# Patient Record
Sex: Male | Born: 1975 | Race: Black or African American | Hispanic: No | Marital: Single | Smoking: Current every day smoker
Health system: Southern US, Community
[De-identification: ages and names within clinical notes are randomized; demographics above are authoritative.]

---

## 2005-09-30 ENCOUNTER — Emergency Department (HOSPITAL_COMMUNITY): Admission: EM | Admit: 2005-09-30 | Discharge: 2005-09-30 | Payer: Self-pay | Admitting: Emergency Medicine

## 2010-05-31 ENCOUNTER — Emergency Department (HOSPITAL_COMMUNITY): Admission: EM | Admit: 2010-05-31 | Discharge: 2010-05-31 | Payer: Self-pay | Admitting: Emergency Medicine

## 2010-11-09 ENCOUNTER — Emergency Department (HOSPITAL_COMMUNITY)
Admission: EM | Admit: 2010-11-09 | Discharge: 2010-11-09 | Payer: Self-pay | Source: Home / Self Care | Admitting: Emergency Medicine

## 2011-02-17 LAB — POCT CARDIAC MARKERS
CKMB, poc: <1 ng/mL — ABNORMAL LOW (ref 1.0–8.0)
Myoglobin, poc: 37.8 ng/mL (ref 12–200)
Troponin i, poc: <0.05 ng/mL (ref 0.00–0.09)

## 2016-10-07 ENCOUNTER — Emergency Department (HOSPITAL_COMMUNITY)
Admission: EM | Admit: 2016-10-07 | Discharge: 2016-10-07 | Disposition: A | Discharge status: Home / Self Care | Payer: Self-pay | Attending: Dermatology | Admitting: Dermatology

## 2016-10-07 ENCOUNTER — Encounter (HOSPITAL_COMMUNITY): Payer: Self-pay

## 2016-10-07 DIAGNOSIS — Y929 Unspecified place or not applicable: Secondary | ICD-10-CM | POA: Insufficient documentation

## 2016-10-07 DIAGNOSIS — S0591XA Unspecified injury of right eye and orbit, initial encounter: Secondary | ICD-10-CM | POA: Insufficient documentation

## 2016-10-07 DIAGNOSIS — Y939 Activity, unspecified: Secondary | ICD-10-CM | POA: Insufficient documentation

## 2016-10-07 DIAGNOSIS — Z5321 Procedure and treatment not carried out due to patient leaving prior to being seen by health care provider: Secondary | ICD-10-CM | POA: Insufficient documentation

## 2016-10-07 DIAGNOSIS — W228XXA Striking against or struck by other objects, initial encounter: Secondary | ICD-10-CM | POA: Insufficient documentation

## 2016-10-07 DIAGNOSIS — Y999 Unspecified external cause status: Secondary | ICD-10-CM | POA: Insufficient documentation
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
## 2018-01-19 ENCOUNTER — Encounter (HOSPITAL_COMMUNITY): Payer: Self-pay | Admitting: Emergency Medicine

## 2018-01-19 ENCOUNTER — Emergency Department (HOSPITAL_COMMUNITY)
Admission: EM | Admit: 2018-01-19 | Discharge: 2018-01-19 | Disposition: A | Discharge status: Home / Self Care | Payer: Self-pay | Attending: Emergency Medicine | Admitting: Emergency Medicine

## 2018-01-19 ENCOUNTER — Emergency Department (HOSPITAL_COMMUNITY): Payer: Self-pay

## 2018-01-19 DIAGNOSIS — F17200 Nicotine dependence, unspecified, uncomplicated: Secondary | ICD-10-CM | POA: Insufficient documentation

## 2018-01-19 DIAGNOSIS — R0789 Other chest pain: Secondary | ICD-10-CM | POA: Insufficient documentation

## 2018-01-19 LAB — COMPREHENSIVE METABOLIC PANEL
ALT: 14 U/L — ABNORMAL LOW (ref 17–63)
AST: 25 U/L (ref 15–41)
Albumin: 4.1 g/dL (ref 3.5–5.0)
Alkaline Phosphatase: 121 U/L (ref 38–126)
Anion gap: 8 (ref 5–15)
BUN: 12 mg/dL (ref 6–20)
CO2: 25 mmol/L (ref 22–32)
Calcium: 9.3 mg/dL (ref 8.9–10.3)
Chloride: 106 mmol/L (ref 101–111)
Creatinine, Ser: 1.01 mg/dL (ref 0.61–1.24)
GFR calc Af Amer: >60 mL/min (ref >60)
GFR calc non Af Amer: >60 mL/min (ref >60)
Glucose, Bld: 92 mg/dL (ref 65–99)
Potassium: 4.3 mmol/L (ref 3.5–5.1)
Sodium: 139 mmol/L (ref 135–145)
Total Bilirubin: 1.2 mg/dL (ref 0.3–1.2)
Total Protein: 7.2 g/dL (ref 6.5–8.1)

## 2018-01-19 LAB — CBC WITH DIFFERENTIAL/PLATELET
Basophils Absolute: 0 10*3/uL (ref 0.0–0.1)
Basophils Relative: 0 %
Eosinophils Absolute: 0.1 10*3/uL (ref 0.0–0.7)
Eosinophils Relative: 1 %
HCT: 43.3 % (ref 39.0–52.0)
Hemoglobin: 14.8 g/dL (ref 13.0–17.0)
Lymphocytes Relative: 21 %
Lymphs Abs: 2.6 10*3/uL (ref 0.7–4.0)
MCH: 32.7 pg (ref 26.0–34.0)
MCHC: 34.2 g/dL (ref 30.0–36.0)
MCV: 95.6 fL (ref 78.0–100.0)
Monocytes Absolute: 0.9 10*3/uL (ref 0.1–1.0)
Monocytes Relative: 7 %
Neutro Abs: 8.9 10*3/uL — ABNORMAL HIGH (ref 1.7–7.7)
Neutrophils Relative %: 71 %
Platelets: 233 10*3/uL (ref 150–400)
RBC: 4.53 MIL/uL (ref 4.22–5.81)
RDW: 14.9 % (ref 11.5–15.5)
WBC: 12.5 10*3/uL — ABNORMAL HIGH (ref 4.0–10.5)

## 2018-01-19 LAB — I-STAT TROPONIN, ED: Troponin i, poc: 0 ng/mL (ref 0.00–0.08)

## 2018-01-19 LAB — D-DIMER, QUANTITATIVE: D-Dimer, Quant: 0.28 ug/mL-FEU (ref 0.00–0.50)

## 2018-01-19 MED ORDER — CYCLOBENZAPRINE HCL 5 MG PO TABS: 5.0000 mg | ORAL_TABLET | Freq: Three times a day (TID) | ORAL | 0 refills | Status: AC | PRN

## 2018-01-19 MED ORDER — MORPHINE SULFATE (PF) 4 MG/ML IV SOLN
4.0000 mg | Freq: Once | INTRAVENOUS | Status: AC
  Administered 2018-01-19: 4 mg via INTRAVENOUS
  Filled 2018-01-19: qty 1

## 2018-01-19 MED ORDER — IBUPROFEN 600 MG PO TABS: 600.0000 mg | ORAL_TABLET | Freq: Four times a day (QID) | ORAL | 0 refills | Status: AC | PRN

## 2018-01-19 NOTE — Discharge Instructions (Signed)
Take motrin for pain.   Take flexeril for muscle spasms.   Expect to be sore for 3-4 days.   Rest for 2 days (including no sex for 2 days).   See your doctor  Return to ER if you have worse chest pain, trouble breathing.

## 2018-01-19 NOTE — ED Provider Notes (Signed)
[facility]-EMERGENCY DEPT Provider Note   CSN: [id number] Arrival date & time: 01/19/18  0935     History   Chief Complaint Chief Complaint  Patient presents with  . Chest Pain    HPI Dustin Haney is a 42 y.o. male smoker here presenting with chest pain.  Patient states that he had sexual intercourse several days ago and afterwards, he has some right-sided chest pain that is worse with movement.  States that when he gets up he has to hold his chest and felt sore when he moves around.  He states that it is also worse when he takes a deep breath as well.  Patient recently traveled here from [place] and is currently living with his girlfriend.  He denies any leg swelling or previous blood clots.  Patient states that he has no known cardiac disease or cardiac stents.  Patient states that he had something similar before and went to [facility] and was diagnosed with chest wall pain. No hx of DM, HTN but patient is a smoker.   The history is provided by the patient.    History reviewed. No pertinent past medical history.  There are no active problems to display for this patient.   History reviewed. No pertinent surgical history.     Home Medications    Prior to Admission medications   Not on File    Family History No family history on file.  Social History Social History   Tobacco Use  . Smoking status: Current Every Day Smoker  . Smokeless tobacco: Never Used  Substance Use Topics  . Alcohol use: Yes  . Drug use: Not on file     Allergies   Latex   Review of Systems Review of Systems  Cardiovascular: Positive for chest pain.  All other systems reviewed and are negative.    Physical Exam Updated Vital Signs BP 130/85 (BP Location: Left Arm)   Pulse 60   Temp 97.9 F (36.6 C) (Oral)   Resp 18   Ht 5' 10.5" (1.791 m)   Wt 72.6 kg (160 lb)   SpO2 99%   BMI 22.63 kg/m   Physical Exam  Constitutional: He appears  well-developed.  HENT:  Head: Normocephalic.  Eyes: Pupils are equal, round, and reactive to light.  Neck: Normal range of motion.  Cardiovascular: Normal rate, regular rhythm and normal pulses.  Pulmonary/Chest: Effort normal and breath sounds normal.  Mild reproducible R sided chest tenderness   Abdominal: Soft. Bowel sounds are normal.  Musculoskeletal: Normal range of motion.       Right lower leg: Normal. He exhibits no tenderness and no edema.       Left lower leg: Normal. He exhibits no tenderness and no edema.  Neurological: He is alert.  Skin: Skin is warm. Capillary refill takes less than 2 seconds.  Psychiatric: He has a normal mood and affect. His behavior is normal.  Nursing note and vitals reviewed.    ED Treatments / Results  Labs (all labs ordered are listed, but only abnormal results are displayed) Labs Reviewed  CBC WITH DIFFERENTIAL/PLATELET  COMPREHENSIVE METABOLIC PANEL  D-DIMER, QUANTITATIVE (NOT AT [facility])  I-STAT TROPONIN, ED    EKG  EKG Interpretation  Date/Time:  Monday January 19 2018 09:48:45 EST Ventricular Rate:  56 PR Interval:    QRS Duration: 87 QT Interval:  420 QTC Calculation: 406 R Axis:   56 Text Interpretation:  Sinus rhythm RSR' in V1 or V2,  probably normal variant Inferior infarct, acute (LCx) ST elevation, consider anterior injury Baseline wander in lead(s) II III aVF J point elevation unchanged since earlier in the day  Confirmed by Richardean CanalYao, Thailyn Khalid H [id number]) on 01/19/2018 10:05:41 AM       Radiology No results found.  Procedures Procedures (including critical care time)  Medications Ordered in ED Medications  morphine 4 MG/ML injection 4 mg (not administered)     Initial Impression / Assessment and Plan / ED Course  I have reviewed the triage vital signs and the nursing notes.  Pertinent labs & imaging results that were available during my care of the patient were reviewed by me and considered in my medical decision making  (see chart for details).     Dustin Haney is a 42 y.o. male here presenting with chest pain after having sex several days ago. There is reproducible R sided tenderness. I suspect muscle strain. Given recent travel, consider PE as well vs ACS. Will get labs, d-dimer, trop x 1 (symptoms more than 24 hrs), CXR.   11:48 AM Pain improved with meds. CXR clear. Trop neg. D-dimer negative. Likely chest wall pain from muscle strain. Recommend rest for 2 days (including no sex). Will dc home with motrin, flexeril.    Final Clinical Impressions(s) / ED Diagnoses   Final diagnoses:  None    ED Discharge Orders    None       Charlynne PanderYao, Ketara Cavness Hsienta, MD 01/19/18 1148

## 2018-01-19 NOTE — ED Triage Notes (Signed)
PER [facility] patient from home for chest wall pain x 4 days. Reports when holds chest with movement pain feels better. Patient is smoker. EKG was normal with EMS.

## 2018-10-01 ENCOUNTER — Emergency Department (HOSPITAL_COMMUNITY)
Admission: EM | Admit: 2018-10-01 | Discharge: 2018-10-01 | Disposition: A | Discharge status: Home / Self Care | Payer: Self-pay | Attending: Emergency Medicine | Admitting: Emergency Medicine

## 2018-10-01 ENCOUNTER — Emergency Department (HOSPITAL_COMMUNITY): Admission: EM | Admit: 2018-10-01 | Discharge: 2018-10-01 | Discharge status: Absent without leave | Payer: Self-pay

## 2018-10-01 ENCOUNTER — Other Ambulatory Visit: Payer: Self-pay

## 2018-10-01 ENCOUNTER — Encounter (HOSPITAL_COMMUNITY): Payer: Self-pay

## 2018-10-01 ENCOUNTER — Emergency Department (HOSPITAL_COMMUNITY): Payer: Self-pay

## 2018-10-01 DIAGNOSIS — Y9389 Activity, other specified: Secondary | ICD-10-CM | POA: Insufficient documentation

## 2018-10-01 DIAGNOSIS — Y999 Unspecified external cause status: Secondary | ICD-10-CM | POA: Insufficient documentation

## 2018-10-01 DIAGNOSIS — Y92009 Unspecified place in unspecified non-institutional (private) residence as the place of occurrence of the external cause: Secondary | ICD-10-CM | POA: Insufficient documentation

## 2018-10-01 DIAGNOSIS — R040 Epistaxis: Secondary | ICD-10-CM | POA: Insufficient documentation

## 2018-10-01 DIAGNOSIS — S060X1A Concussion with loss of consciousness of 30 minutes or less, initial encounter: Secondary | ICD-10-CM

## 2018-10-01 DIAGNOSIS — Z23 Encounter for immunization: Secondary | ICD-10-CM | POA: Insufficient documentation

## 2018-10-01 DIAGNOSIS — S022XXA Fracture of nasal bones, initial encounter for closed fracture: Secondary | ICD-10-CM | POA: Insufficient documentation

## 2018-10-01 MED ORDER — OXYMETAZOLINE HCL 0.05 % NA SOLN
1.0000 | Freq: Once | NASAL | Status: AC
  Administered 2018-10-01: 1 via NASAL
  Filled 2018-10-01: qty 15

## 2018-10-01 MED ORDER — TETANUS-DIPHTH-ACELL PERTUSSIS 5-2.5-18.5 LF-MCG/0.5 IM SUSP
0.5000 mL | Freq: Once | INTRAMUSCULAR | Status: AC
  Administered 2018-10-01: 0.5 mL via INTRAMUSCULAR
  Filled 2018-10-01: qty 0.5

## 2018-10-01 MED ORDER — ONDANSETRON 4 MG PO TBDP
4.0000 mg | ORAL_TABLET | Freq: Once | ORAL | Status: AC
  Administered 2018-10-01: 4 mg via ORAL
  Filled 2018-10-01: qty 1

## 2018-10-01 MED ORDER — TRAMADOL HCL 50 MG PO TABS: 50.0000 mg | ORAL_TABLET | Freq: Four times a day (QID) | ORAL | 0 refills | Status: AC | PRN

## 2018-10-01 MED ORDER — IBUPROFEN 600 MG PO TABS: 600.0000 mg | ORAL_TABLET | Freq: Four times a day (QID) | ORAL | 0 refills | Status: AC | PRN

## 2018-10-01 MED ORDER — AMOXICILLIN-POT CLAVULANATE 875-125 MG PO TABS: 1.0000 | ORAL_TABLET | Freq: Two times a day (BID) | ORAL | 0 refills | Status: AC

## 2018-10-01 MED ORDER — MORPHINE SULFATE (PF) 4 MG/ML IV SOLN
4.0000 mg | Freq: Once | INTRAVENOUS | Status: AC
  Administered 2018-10-01: 4 mg via INTRAMUSCULAR
  Filled 2018-10-01: qty 1

## 2018-10-01 MED ORDER — OXYMETAZOLINE HCL 0.05 % NA SOLN
1.0000 | Freq: Two times a day (BID) | NASAL | 0 refills | Status: AC | PRN
Start: 2018-10-01 — End: ?
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
## 2018-10-01 NOTE — ED Notes (Signed)
Patient transported to CT 

## 2018-10-01 NOTE — Discharge Instructions (Addendum)
You were found to have a nasal fracture. Please see attached handout.   Please take all of your antibiotics until finished!   You may develop abdominal discomfort or diarrhea from the antibiotic.  You may help offset this with probiotics which you can buy or get in yogurt. Do not eat or take the probiotics until 2 hours after your antibiotic. Do not take your medicine if develop an itchy rash, swelling in your mouth or lips, or difficulty breathing.   I am giving you Oxymetazoline - nasal spray for congestion. Do not use for more than 3 days because this medicine can cause rebound congestion.   For pain control you may take:  600mg  of ibuprofen (that is usually 3 over the counter pills)  3 times a day (take with food) and acetaminophen 650mg  (this is 2 over the counter pills) four times a day. Do not drink alcohol or combine with other medications that have acetaminophen as an ingredient (Read the labels!).    For pain control you may take: 800mg  of ibuprofen (that is usually four 200mg  over the counter pills) up to 3 times a day (please take with food) and acetaminophen 975mg  (this is 3 normal strength, 325mg , over the counter pills) up to four times a day. Please do not take more than this. Do not drink alcohol or combine with other medications that have acetaminophen or Ibuprofen as an ingredient (Read the labels!).    For breakthrough pain you may take Ultram. Do not drink alcohol drive or operate heavy machinery when taking. You are being provided a prescription for opiates (also known as narcotics) for pain control on an as needed basis.  Opiates can be addictive and should only be used when absolutely necessary for pain control when other alternatives do not work.  We recommend you only use them for the recommended amount of time and only as prescribed.  Please do not take with other sedative medications or alcohol.  Please do not drive, operate machinery, or make important decisions while taking  opiates.  Please note that these medications can be addictive and have high abuse potential.  Please keep these medications locked away from children, teenagers or any family members with history of substance abuse. Additionally, these medications may cause constipation - take over the counter stool softeners or add fiber to your diet to treat this (Metamucil, Psyllium Fiber, Colace, Miralax) Further refills will need to be obtained from your primary care doctor and will not be prescribed through the Emergency Department. You will test positive on most drug tests while taking this medication.   Please follow up with ENT in the next week for follow up. If you develop worsening or new concerning symptoms you can return to the emergency department for re-evaluation.   NOSE BLEED You may use nasal saline to keep your mucous membranes moist. You may use a humidifier.  Other than nasal saline, please do not put anything into your nose for the next 3-4 days. Please do not blow your nose for the next 3-4 days. If your nose begins to bleed again, at this time it is okay to blow your nose and blow out all of the clots and then spray Afrin nasal spray into both nostrils and hold direct pressure for 30 minutes without stopping. Do not tilt your head back while holding direct pressure as this may lead to nausea. If this does not stop the bleeding, please return to the hospital.  For your dental injury,  use dental resources to find a dentist. Schedule an appointmet in the next 1 week.   Follow these instructions at home: Put ice on the injured area: Put ice in a plastic bag. Place a towel between your skin and the bag. Leave the ice on for 20 minutes, 2-3 times per day. If your nose bleeds, sit up while you gently squeeze your nose shut for 10 minutes. DO NOT BLOW YOUR NOSE Return to your normal activities as told by your doctor. Ask your doctor what activities are safe for you. Do not play contact sports for  3-4 weeks or as told by your doctor. Keep all follow-up visits as told by your doctor. This is important. - Follow up with ENT as above.  Contact a doctor if: You have more pain or very bad pain. You keep having nosebleeds. The shape of your nose does not return to normal after 5 days. You have pus coming out of your nose. Get help right away if: Your nose bleeds for more than 20 minutes after even after continuously pinching the bridge of your nose for the full 20 minutes. You have clear fluid draining out of your nose. You have a grape-like swelling on the inside of your nose. You have trouble moving your eyes. You keep throwing up (vomiting).
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
## 2019-10-24 IMAGING — CR DG CHEST 2V
2 series · 2 of 2 positions shown · non-contrast
Comparison: None.

CLINICAL DATA: Chest pain.

EXAM:
CHEST  2 VIEW

[w chest pa]
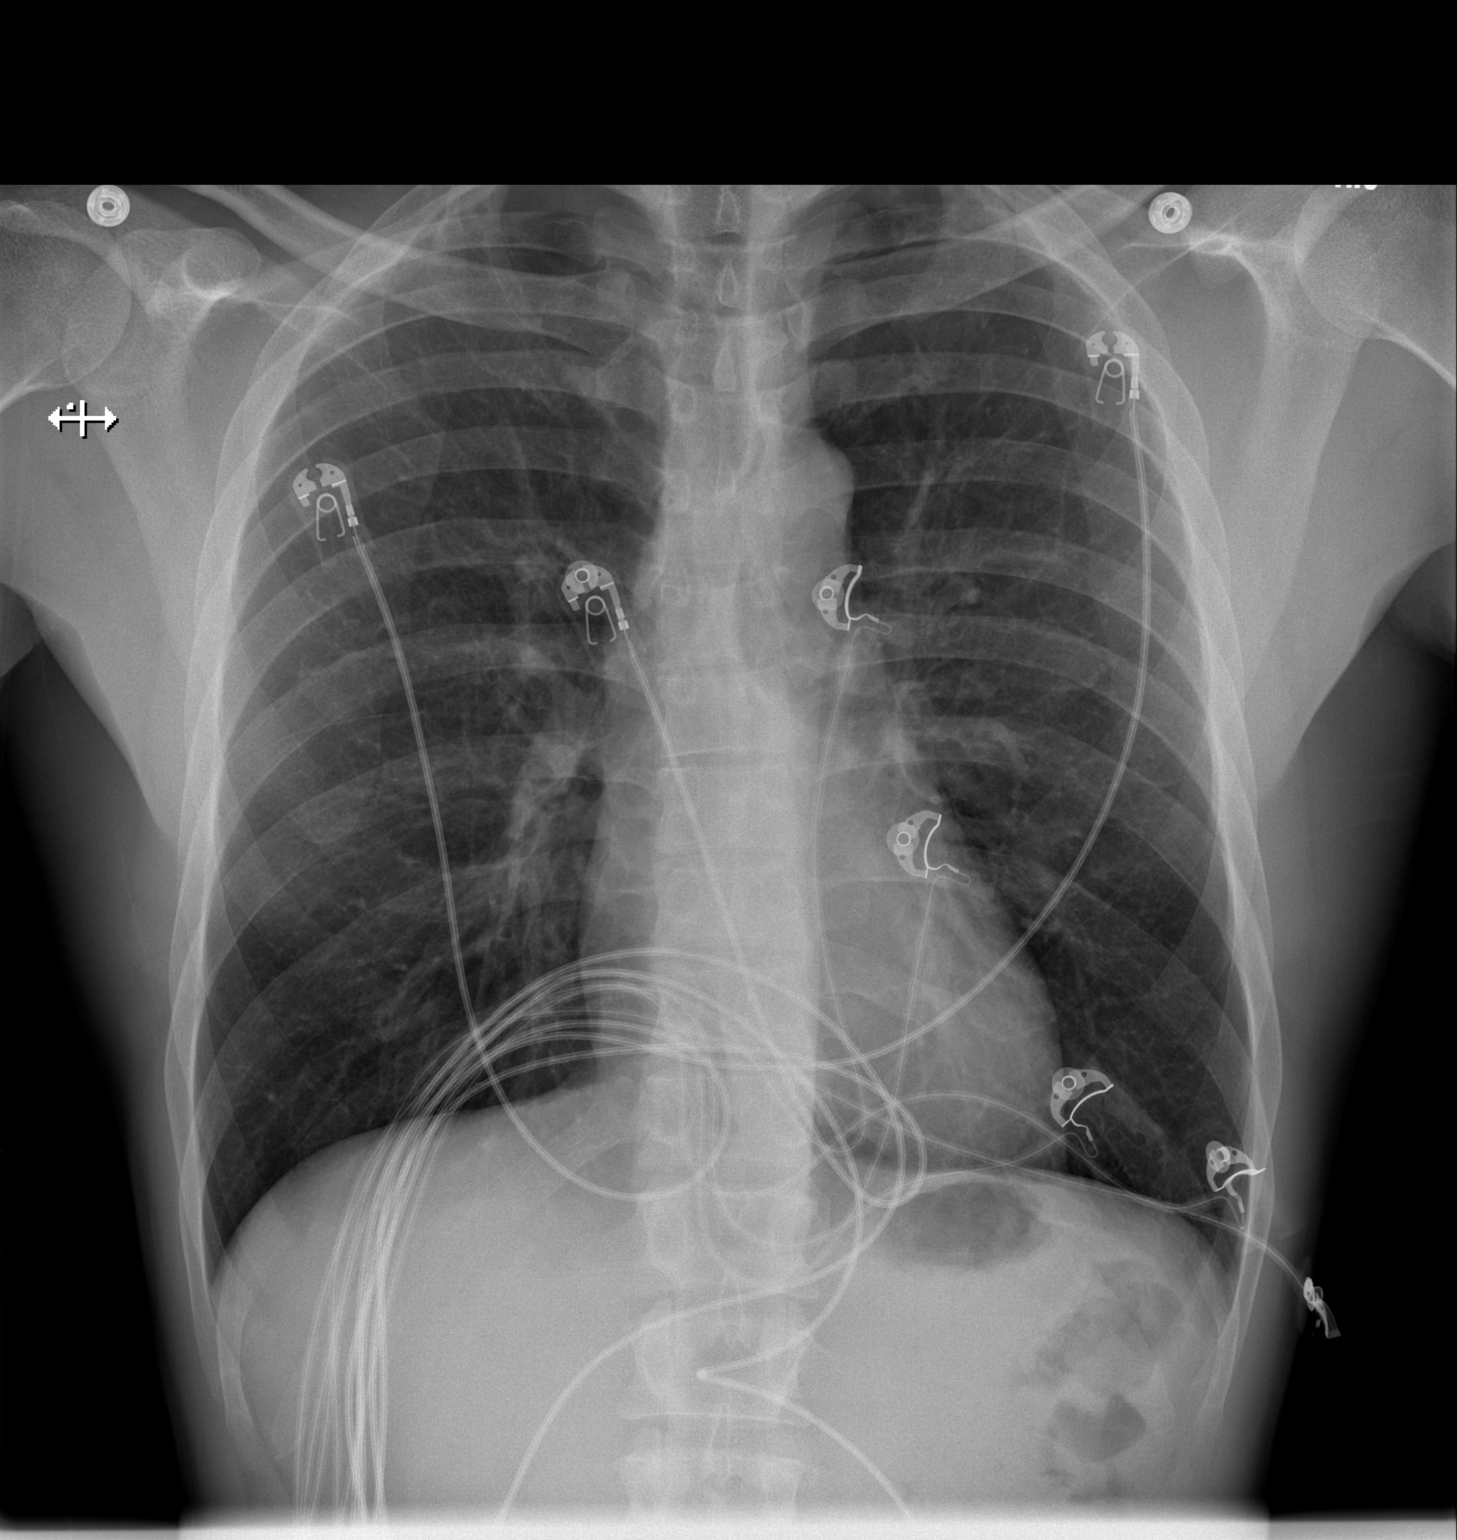

[w chest lat]
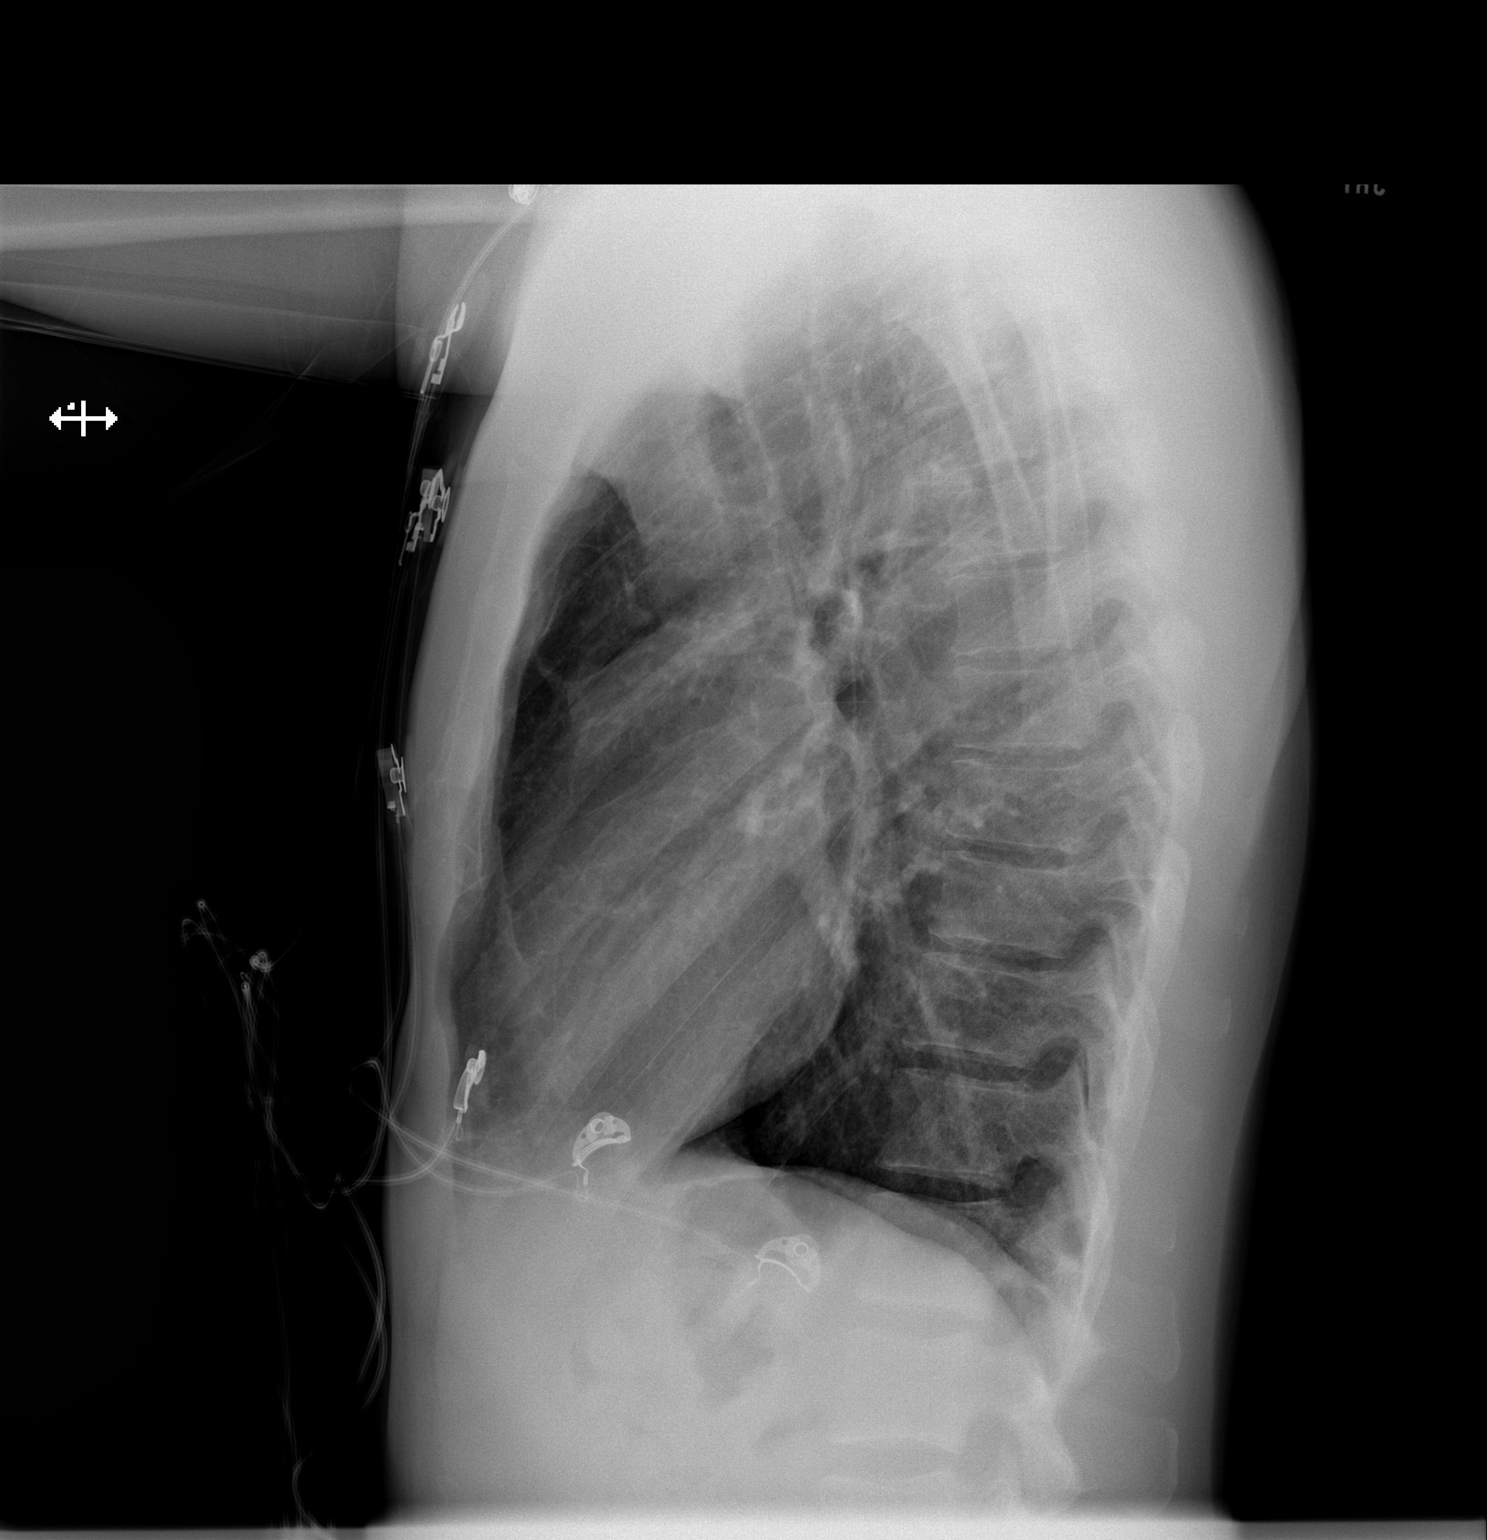

[2 of 2 positions shown; findings below may reference images not displayed]

FINDINGS: The heart size and mediastinal contours are within normal limits.
Both lungs are clear. No pneumothorax or pleural effusion is noted.
The visualized skeletal structures are unremarkable.
IMPRESSION: No active cardiopulmonary disease.

## 2021-11-01 ENCOUNTER — Emergency Department (HOSPITAL_COMMUNITY)
Admission: EM | Admit: 2021-11-01 | Discharge: 2021-11-01 | Disposition: A | Discharge status: Home / Self Care | Payer: Self-pay | Attending: Emergency Medicine | Admitting: Emergency Medicine

## 2021-11-01 ENCOUNTER — Encounter (HOSPITAL_COMMUNITY): Payer: Self-pay | Admitting: Emergency Medicine

## 2021-11-01 DIAGNOSIS — F17200 Nicotine dependence, unspecified, uncomplicated: Secondary | ICD-10-CM | POA: Insufficient documentation

## 2021-11-01 DIAGNOSIS — J101 Influenza due to other identified influenza virus with other respiratory manifestations: Secondary | ICD-10-CM | POA: Insufficient documentation

## 2021-11-01 DIAGNOSIS — J111 Influenza due to unidentified influenza virus with other respiratory manifestations: Secondary | ICD-10-CM

## 2021-11-01 DIAGNOSIS — Z20822 Contact with and (suspected) exposure to covid-19: Secondary | ICD-10-CM | POA: Insufficient documentation

## 2021-11-01 DIAGNOSIS — Z91040 Latex allergy status: Secondary | ICD-10-CM | POA: Insufficient documentation

## 2021-11-01 DIAGNOSIS — Z7982 Long term (current) use of aspirin: Secondary | ICD-10-CM | POA: Insufficient documentation

## 2021-11-01 LAB — COMPREHENSIVE METABOLIC PANEL
ALT: 21 U/L (ref 0–44)
AST: 37 U/L (ref 15–41)
Albumin: 3.5 g/dL (ref 3.5–5.0)
Alkaline Phosphatase: 87 U/L (ref 38–126)
Anion gap: 10 (ref 5–15)
BUN: 10 mg/dL (ref 6–20)
CO2: 20 mmol/L — ABNORMAL LOW (ref 22–32)
Calcium: 8.9 mg/dL (ref 8.9–10.3)
Chloride: 102 mmol/L (ref 98–111)
Creatinine, Ser: 1.2 mg/dL (ref 0.61–1.24)
GFR, Estimated: >60 mL/min (ref >60)
Glucose, Bld: 149 mg/dL — ABNORMAL HIGH (ref 70–99)
Potassium: 4.2 mmol/L (ref 3.5–5.1)
Sodium: 132 mmol/L — ABNORMAL LOW (ref 135–145)
Total Bilirubin: 0.3 mg/dL (ref 0.3–1.2)
Total Protein: 7.1 g/dL (ref 6.5–8.1)

## 2021-11-01 LAB — CBC
HCT: 50.4 % (ref 39.0–52.0)
Hemoglobin: 17.3 g/dL — ABNORMAL HIGH (ref 13.0–17.0)
MCH: 33.3 pg (ref 26.0–34.0)
MCHC: 34.3 g/dL (ref 30.0–36.0)
MCV: 97.1 fL (ref 80.0–100.0)
Platelets: 246 10*3/uL (ref 150–400)
RBC: 5.19 MIL/uL (ref 4.22–5.81)
RDW: 13.9 % (ref 11.5–15.5)
WBC: 12.4 10*3/uL — ABNORMAL HIGH (ref 4.0–10.5)
nRBC: 0 % (ref 0.0–0.2)

## 2021-11-01 LAB — RESP PANEL BY RT-PCR (FLU A&B, COVID) ARPGX2
Influenza A by PCR: POSITIVE — AB
Influenza B by PCR: NEGATIVE
SARS Coronavirus 2 by RT PCR: NEGATIVE

## 2021-11-01 MED ORDER — ONDANSETRON HCL 4 MG PO TABS: 4.0000 mg | ORAL_TABLET | Freq: Four times a day (QID) | ORAL | 0 refills | Status: AC

## 2021-11-01 MED ORDER — ONDANSETRON 4 MG PO TBDP
4.0000 mg | ORAL_TABLET | Freq: Once | ORAL | Status: AC
  Administered 2021-11-01: 4 mg via ORAL
  Filled 2021-11-01: qty 1

## 2021-11-01 NOTE — ED Provider Notes (Signed)
Emergency Medicine Provider Triage Evaluation Note  Dustin Haney , a 45 y.o. male  was evaluated in triage.  Pt complains of nausea, vomiting, diarrhea, and upper respiratory symptoms. He states that his symptoms have been ongoing for 3 days with several bouts of diarrhea and NBNB vomiting with decreased oral intake since onset of symptoms. He also endorses fevers, chills, cough, congestion, and generalized body aches. Denies chest pain or shortness of breath.  Review of Systems  Positive:  Negative: See above  Physical Exam  BP (!) 115/94 (BP Location: Right Arm)   Pulse (!) 115   Temp 99.7 F (37.6 C) (Oral)   Resp 14   SpO2 97%  Gen:   Awake, no distress   Resp:  Normal effort  MSK:   Moves extremities without difficulty  Other:    Medical Decision Making  Medically screening exam initiated at 9:42 AM.  Appropriate orders placed.  Boniface Goffe Moffit was informed that the remainder of the evaluation will be completed by another provider, this initial triage assessment does not replace that evaluation, and the importance of remaining in the ED until their evaluation is complete.     Silva Bandy, PA-C 11/01/21 0945    Rozelle Logan, DO 11/02/21 1021

## 2021-11-01 NOTE — ED Triage Notes (Signed)
Patient here with complaint of emesis and generalized body aches that started three days ago. Patient alert, oriented, and in no apparent distress at this time.

## 2021-11-01 NOTE — ED Provider Notes (Signed)
[facility] EMERGENCY DEPARTMENT Provider Note   CSN: [id number] Arrival date & time: 11/01/21  [id number]     History Chief Complaint  Patient presents with   Generalized Body Aches    Dustin Haney is a 45 y.o. male.  +  The history is provided by the patient and medical records.  Cough Cough characteristics:  Non-productive Severity:  Moderate Onset quality:  Gradual Duration:  3 days Timing:  Constant Progression:  Worsening Chronicity:  New Context: sick contacts and upper respiratory infection   Relieved by:  Nothing Worsened by:  Nothing Associated symptoms: chills and rhinorrhea   Associated symptoms: no chest pain, no ear pain, no fever, no rash, no shortness of breath and no sore throat       History reviewed. No pertinent past medical history.  There are no problems to display for this patient.   No past surgical history on file.     No family history on file.  Social History   Tobacco Use   Smoking status: Every Day   Smokeless tobacco: Never  Vaping Use   Vaping Use: Never used  Substance Use Topics   Alcohol use: Yes    Home Medications Prior to Admission medications   Medication Sig Start Date End Date Taking? Authorizing Provider  ondansetron (ZOFRAN) 4 MG tablet Take 1 tablet (4 mg total) by mouth every 6 (six) hours. 11/01/21  Yes Jenalyn Girdner, Burnadette Peter, MD  Aspirin-Acetaminophen-Caffeine (GOODYS EXTRA STRENGTH PO) Take 1 packet by mouth every 6 (six) hours as needed (For chest pain.).    [provider]  cyclobenzaprine (FLEXERIL) 5 MG tablet Take 1 tablet (5 mg total) by mouth 3 (three) times daily as needed for muscle spasms. 01/19/18   Drenda Freeze, MD  ibuprofen (ADVIL,MOTRIN) 600 MG tablet Take 1 tablet (600 mg total) by mouth every 6 (six) hours as needed. 01/19/18   Drenda Freeze, MD    Allergies    Latex  Review of Systems   Review of Systems  Constitutional:  Positive for appetite change  and chills. Negative for fever.  HENT:  Positive for congestion and rhinorrhea. Negative for ear pain and sore throat.   Eyes:  Negative for pain and visual disturbance.  Respiratory:  Positive for cough. Negative for shortness of breath.   Cardiovascular:  Negative for chest pain and palpitations.  Gastrointestinal:  Positive for diarrhea, nausea and vomiting. Negative for abdominal pain.  Genitourinary:  Negative for dysuria and hematuria.  Musculoskeletal:  Negative for arthralgias and back pain.  Skin:  Negative for color change and rash.  Neurological:  Negative for seizures and syncope.  All other systems reviewed and are negative.  Physical Exam Updated Vital Signs BP 115/89 (BP Location: Left Arm)   Pulse 99   Temp 99 F (37.2 C) (Oral)   Resp 16   SpO2 92%   Physical Exam Vitals and nursing note reviewed.  Constitutional:      General: He is not in acute distress.    Appearance: Normal appearance. He is well-developed.  HENT:     Head: Normocephalic and atraumatic.     Right Ear: External ear normal.     Left Ear: External ear normal.     Nose: Nose normal. No congestion.     Mouth/Throat:     Mouth: Mucous membranes are moist.     Pharynx: Oropharynx is clear. No posterior oropharyngeal erythema.  Eyes:     Extraocular Movements: Extraocular movements  intact.     Conjunctiva/sclera: Conjunctivae normal.     Pupils: Pupils are equal, round, and reactive to light.  Cardiovascular:     Rate and Rhythm: Normal rate and regular rhythm.     Pulses: Normal pulses.     Heart sounds: No murmur heard. Pulmonary:     Effort: Pulmonary effort is normal. No respiratory distress.     Breath sounds: Normal breath sounds. No wheezing, rhonchi or rales.  Abdominal:     General: Abdomen is flat. Bowel sounds are normal.     Palpations: Abdomen is soft.     Tenderness: There is no abdominal tenderness. There is no guarding or rebound.  Musculoskeletal:        General: No  swelling, tenderness or deformity. Normal range of motion.     Cervical back: Normal range of motion and neck supple. No rigidity.  Skin:    General: Skin is warm and dry.     Capillary Refill: Capillary refill takes less than 2 seconds.     Findings: No rash.  Neurological:     General: No focal deficit present.     Mental Status: He is alert and oriented to person, place, and time.  Psychiatric:        Mood and Affect: Mood normal.    ED Results / Procedures / Treatments   Labs (all labs ordered are listed, but only abnormal results are displayed) Labs Reviewed  RESP PANEL BY RT-PCR (FLU A&B, COVID) ARPGX2 - Abnormal; Notable for the following components:      Result Value   Influenza A by PCR POSITIVE (*)    All other components within normal limits  CBC - Abnormal; Notable for the following components:   WBC 12.4 (*)    Hemoglobin 17.3 (*)    All other components within normal limits  COMPREHENSIVE METABOLIC PANEL - Abnormal; Notable for the following components:   Sodium 132 (*)    CO2 20 (*)    Glucose, Bld 149 (*)    All other components within normal limits    EKG None  Radiology No results found.  Procedures Procedures   Medications Ordered in ED Medications  ondansetron (ZOFRAN-ODT) disintegrating tablet 4 mg (4 mg Oral Given 11/01/21 1524)    ED Course  I have reviewed the triage vital signs and the nursing notes.  Pertinent labs & imaging results that were available during my care of the patient were reviewed by me and considered in my medical decision making (see chart for details).    MDM Rules/Calculators/A&P                          45 year old male with no past medical problems presenting with flulike symptoms as above.  He is afebrile and hemodynamically stable here.  He is flu positive. Electrolytes reassuring. Creatine normal.  Symptoms ongoing for greater than 3 days.  No indications for Tamiflu at this time.  We will send home with a  prescription for Zofran and close follow-up with PCP for reassessment.  Strict return to ED precautions provided.   Final Clinical Impression(s) / ED Diagnoses Final diagnoses:  Influenza    Rx / DC Orders ED Discharge Orders          Ordered    ondansetron (ZOFRAN) 4 MG tablet  Every 6 hours        11/01/21 1523  Lutricia Feil, MD 11/01/21 1536    Lorre Nick, MD 11/01/21 1547

## 2021-11-01 NOTE — ED Provider Notes (Signed)
I saw and evaluated the patient, reviewed the resident's note and I agree with the findings and plan.     45 year old male who presents with URI symptoms.  He is flu positive here.  Vital signs stable.  Will discharge home   Lorre Nick, MD 11/01/21 1535

## 2022-04-18 ENCOUNTER — Other Ambulatory Visit: Payer: Self-pay

## 2022-04-18 ENCOUNTER — Encounter (HOSPITAL_COMMUNITY): Payer: Self-pay | Admitting: Emergency Medicine

## 2022-04-18 ENCOUNTER — Emergency Department (HOSPITAL_COMMUNITY)
Admission: EM | Admit: 2022-04-18 | Discharge: 2022-04-18 | Disposition: A | Discharge status: Home / Self Care | Payer: Self-pay | Attending: Emergency Medicine | Admitting: Emergency Medicine

## 2022-04-18 ENCOUNTER — Emergency Department (HOSPITAL_COMMUNITY): Payer: Self-pay

## 2022-04-18 DIAGNOSIS — M5442 Lumbago with sciatica, left side: Secondary | ICD-10-CM

## 2022-04-18 DIAGNOSIS — M545 Low back pain, unspecified: Secondary | ICD-10-CM | POA: Insufficient documentation

## 2022-04-18 MED ORDER — PREDNISONE 10 MG (21) PO TBPK: ORAL_TABLET | Freq: Every day | ORAL | 0 refills | Status: AC

## 2022-04-18 MED ORDER — KETOROLAC TROMETHAMINE 60 MG/2ML IM SOLN
30.0000 mg | Freq: Once | INTRAMUSCULAR | Status: AC
  Administered 2022-04-18: 30 mg via INTRAMUSCULAR
  Filled 2022-04-18: qty 2

## 2022-04-18 MED ORDER — METHOCARBAMOL 500 MG PO TABS: 500.0000 mg | ORAL_TABLET | Freq: Two times a day (BID) | ORAL | 0 refills | Status: AC

## 2022-04-18 MED ORDER — LIDOCAINE 5 % EX PTCH
1.0000 | MEDICATED_PATCH | CUTANEOUS | Status: DC
  Administered 2022-04-18: 1 via TRANSDERMAL
  Filled 2022-04-18: qty 1

## 2022-04-18 MED ORDER — PREDNISONE 20 MG PO TABS
60.0000 mg | ORAL_TABLET | Freq: Once | ORAL | Status: AC
Start: 2022-04-18 — End: 2022-04-18
  Administered 2022-04-18: 60 mg via ORAL
  Filled 2022-04-18: qty 3

## 2022-04-18 NOTE — Discharge Instructions (Signed)
Please pick up medications and take as prescribed. DO NOT DRIVE OR DRINK ALCOHOL WHILE ON THE MUSCLE RELAXER AS IT CAN MAKE YOU DROWSY.   You should also avoid NSAIDs (Ibuprofen/Motrin, Advil, Aleve) while taking the steroids. You can take Tylenol for additional pain relief. You can also  buy OTC Salon Pas patches to apply to your back to help with pain.   Follow up with your PCP for further eval. If you do not have one you can follow up with [facility] for primary care needs.   Return to the ED for any new/worsening symptoms  including worsening pain, inability to ambulate, weakness/numbness in your legs, numbness in your groin, feeling like you are holding onto urine, peeing or pooping on yourself, fevers > 100.4, or any other new/concerning symptoms.

## 2022-04-18 NOTE — ED Provider Notes (Signed)
[facility] EMERGENCY DEPARTMENT Provider Note   CSN: [id number] Arrival date & time: 04/18/22  0225     History  Chief Complaint  Patient presents with   Back/Hip Pain     Dustin Haney is a 46 y.o. male who presents to the ED today via EMS with complaint of gradual onset, constant, achy, lower back pain with radiation into LLE x 3 days. Pt reports hx of same years ago however cannot recall diagnosis. Denies any recent trauma to cause the back pain. Per EMS pt did fall earlier tonight due to his back/leg pain however denies worsening pain from fall. No head injury or LOC. He has been taking Tylenol at home without much relief. No other complaints at this time. Denies fevers, chills, saddle anesthesia, urinary retention, urinary or bowel incontinence. Denies IVDA.   The history is provided by the patient and medical records.      Home Medications Prior to Admission medications   Medication Sig Start Date End Date Taking? Authorizing Provider  methocarbamol (ROBAXIN) 500 MG tablet Take 1 tablet (500 mg total) by mouth 2 (two) times daily. 04/18/22  Yes Toben Acuna, PA-C  predniSONE (STERAPRED UNI-PAK 21 TAB) 10 MG (21) TBPK tablet Take by mouth daily. Follow package insert 04/18/22  Yes Nester Bachus, PA-C  amoxicillin-clavulanate (AUGMENTIN) 875-125 MG tablet Take 1 tablet by mouth every 12 (twelve) hours. 10/01/18   Maczis, Barth Kirks, PA-C  ibuprofen (ADVIL,MOTRIN) 600 MG tablet Take 1 tablet (600 mg total) by mouth every 6 (six) hours as needed. 10/01/18   Maczis, Barth Kirks, PA-C  oxymetazoline (AFRIN NASAL SPRAY) 0.05 % nasal spray Place 1 spray into both nostrils 2 (two) times daily as needed for congestion. 10/01/18   Maczis, Barth Kirks, PA-C  traMADol (ULTRAM) 50 MG tablet Take 1 tablet (50 mg total) by mouth every 6 (six) hours as needed. 10/01/18   Maczis, Barth Kirks, PA-C      Allergies    Patient has no known allergies.    Review of Systems   Review of  Systems  Constitutional:  Negative for chills and fever.  Genitourinary:  Negative for difficulty urinating.  Musculoskeletal:  Positive for arthralgias and back pain.  Skin:  Negative for color change.  Neurological:  Negative for weakness and numbness.  All other systems reviewed and are negative.  Physical Exam Updated Vital Signs BP 130/77 (BP Location: Right Arm)   Pulse 83   Temp 97.6 F (36.4 C) (Oral)   Resp 18   Ht 5\' 10"  (1.778 m)   Wt 72 kg   SpO2 99%   BMI 22.78 kg/m  Physical Exam Vitals and nursing note reviewed.  Constitutional:      Appearance: He is not ill-appearing.  HENT:     Head: Normocephalic and atraumatic.  Eyes:     Conjunctiva/sclera: Conjunctivae normal.  Cardiovascular:     Rate and Rhythm: Normal rate and regular rhythm.     Pulses: Normal pulses.  Pulmonary:     Effort: Pulmonary effort is normal.     Breath sounds: Normal breath sounds. No wheezing, rhonchi or rales.  Abdominal:     Palpations: Abdomen is soft.     Tenderness: There is no abdominal tenderness.  Musculoskeletal:     Cervical back: Neck supple.     Comments: No C or T midline spinal TTP. + lumbar midline spinal TTP with associated L paralumbar musculature TTP. ROM intact to neck and back. + SLR on  left side. Strength 5/5 to BLEs. Sensation intact throughout. 2+ DP Pulses bilaterally.   Skin:    General: Skin is warm and dry.  Neurological:     Mental Status: He is alert.    ED Results / Procedures / Treatments   Labs (all labs ordered are listed, but only abnormal results are displayed) Labs Reviewed - No data to display  EKG None  Radiology DG Lumbar Spine Complete  Result Date: 04/18/2022 CLINICAL DATA:  Back pain EXAM: LUMBAR SPINE - COMPLETE 4+ VIEW COMPARISON:  None Available. FINDINGS: Five lumbar-type vertebral bodies. Normal lumbar lordosis. No evidence of fracture or dislocation. Vertebral body heights are maintained. Visualized bony pelvis appears  intact. IMPRESSION: Negative. Electronically Signed   By: Julian Hy M.D.   On: 04/18/2022 02:55    Procedures Procedures    Medications Ordered in ED Medications  lidocaine (LIDODERM) 5 % 1 patch (1 patch Transdermal Patch Applied 04/18/22 0336)  ketorolac (TORADOL) injection 30 mg (30 mg Intramuscular Given 04/18/22 0335)  predniSONE (DELTASONE) tablet 60 mg (60 mg Oral Given 04/18/22 0334)    ED Course/ Medical Decision Making/ A&P                           Medical Decision Making 46 year old male who presents to the ED today with complaint of atraumatic lower back pain with radiation down left lower extremity that began 2 or 3 days ago.  History of same several years ago.  On arrival to the ED today vitals are stable.  Patient appears to be in no acute distress.  He is medically screened by me in triage.  He was noted to have some midline lumbar spinal tenderness palpation with associated left paralumbar musculature tenderness.  Neurovascularly intact throughout.  No red flag symptoms today concerning for cauda equina, spinal epidural abscess, AAA.  X-ray of his L-spine was ordered given midline tenderness palpation.   X-ray negative at this time.  Will provide Toradol, lidocaine patch, prednisone with reevaluation.   On repeat eval pt resting comfortably, sleeping. Reports improvement in his pain. Will plan to discharge home with additional course of steroids and muscle relaxer. Pt advised to follow up with PCP for further eval. He is provided info for [facility]. He is instructed on strict return precautions and otherwise stable for discharge home.    Amount and/or Complexity of Data Reviewed Radiology: ordered.  Risk Prescription drug management.          Final Clinical Impression(s) / ED Diagnoses Final diagnoses:  Acute left-sided low back pain with left-sided sciatica    Rx / DC Orders ED Discharge Orders          Ordered    predniSONE  (STERAPRED UNI-PAK 21 TAB) 10 MG (21) TBPK tablet  Daily        04/18/22 0358    methocarbamol (ROBAXIN) 500 MG tablet  2 times daily        04/18/22 0358             Discharge Instructions      Please pick up medications and take as prescribed. DO NOT DRIVE OR DRINK ALCOHOL WHILE ON THE MUSCLE RELAXER AS IT CAN MAKE YOU DROWSY.   You should also avoid NSAIDs (Ibuprofen/Motrin, Advil, Aleve) while taking the steroids. You can take Tylenol for additional pain relief. You can also  buy OTC Salon Pas patches to apply to your back to help  with pain.   Follow up with your PCP for further eval. If you do not have one you can follow up with [facility] for primary care needs.   Return to the ED for any new/worsening symptoms  including worsening pain, inability to ambulate, weakness/numbness in your legs, numbness in your groin, feeling like you are holding onto urine, peeing or pooping on yourself, fevers > 100.4, or any other new/concerning symptoms.        Eustaquio Maize, PA-C 04/18/22 0400    Maudie Flakes, MD 04/18/22 [phone]

## 2022-04-18 NOTE — ED Triage Notes (Signed)
[place] bus [place] , patient reports left lower back pain radiating to left hip and left upper leg for several days , fell this evening due to pain .

## 2022-04-18 NOTE — ED Provider Triage Note (Signed)
Emergency Medicine Provider Triage Evaluation Note  Dustin Haney , a 46 y.o. male  was evaluated in triage.  Pt complains of gradual onset, constant, achy, lower back pain with radiation into LLE x 3 days. Pt reports hx of same years ago. Denies any recent trauma to cause the back pain. Per EMS pt did fall earlier tonight due to his back/leg pain however denies worsening pain from fall. No head injury or LOC.  Review of Systems  Positive: + back pain, LLE pain Negative: - weakness, numbness, saddle anesthesia, urinary retention, urinary or bowel incontinence  Physical Exam  BP 130/77 (BP Location: Right Arm)   Pulse 83   Temp 97.6 F (36.4 C) (Oral)   Resp 18   SpO2 99%  Gen:   Awake, no distress   Resp:  Normal effort  MSK:   Moves extremities without difficulty  Other:  + midline lumbar spinal TTP with associated left paralumbar musculature TTP. Positive Slr on left. Strength and sensation intact to BLEs.   Medical Decision Making  Medically screening exam initiated at 2:32 AM.  Appropriate orders placed.  [name] was informed that the remainder of the evaluation will be completed by another provider, this initial triage assessment does not replace that evaluation, and the importance of remaining in the ED until their evaluation is complete.     Tanda Rockers, PA-C 04/18/22 [phone]

## 2022-04-18 NOTE — ED Notes (Signed)
Patient verbalizes understanding of discharge instructions. Opportunity for questioning and answers were provided. Armband removed by staff, pt discharged from ED. Pt ambulatory to ED waiting room. 

## 2022-04-25 ENCOUNTER — Encounter (HOSPITAL_COMMUNITY): Payer: Self-pay | Admitting: Emergency Medicine

## 2024-07-19 ENCOUNTER — Encounter (HOSPITAL_COMMUNITY): Payer: Self-pay | Admitting: Emergency Medicine

## 2024-07-19 ENCOUNTER — Emergency Department (HOSPITAL_COMMUNITY)
Admission: EM | Admit: 2024-07-19 | Discharge: 2024-07-20 | Disposition: A | Discharge status: Home / Self Care | Payer: Self-pay | Attending: Emergency Medicine | Admitting: Emergency Medicine

## 2024-07-19 ENCOUNTER — Other Ambulatory Visit: Payer: Self-pay

## 2024-07-19 ENCOUNTER — Emergency Department (HOSPITAL_COMMUNITY): Payer: Self-pay

## 2024-07-19 DIAGNOSIS — J069 Acute upper respiratory infection, unspecified: Secondary | ICD-10-CM | POA: Insufficient documentation

## 2024-07-19 DIAGNOSIS — Z91040 Latex allergy status: Secondary | ICD-10-CM | POA: Insufficient documentation

## 2024-07-19 LAB — RESP PANEL BY RT-PCR (RSV, FLU A&B, COVID)  RVPGX2
Influenza A by PCR: NEGATIVE
Influenza B by PCR: NEGATIVE
Resp Syncytial Virus by PCR: NEGATIVE
SARS Coronavirus 2 by RT PCR: NEGATIVE

## 2024-07-19 MED ORDER — ALBUTEROL SULFATE HFA 108 (90 BASE) MCG/ACT IN AERS
1.0000 | INHALATION_SPRAY | Freq: Once | RESPIRATORY_TRACT | Status: AC
  Administered 2024-07-19: 1 via RESPIRATORY_TRACT
  Filled 2024-07-19: qty 6.7

## 2024-07-19 MED ORDER — LORATADINE 10 MG PO TABS: 10.0000 mg | ORAL_TABLET | Freq: Every day | ORAL | 0 refills | Status: AC

## 2024-07-19 MED ORDER — DEXAMETHASONE SODIUM PHOSPHATE 10 MG/ML IJ SOLN
10.0000 mg | Freq: Once | INTRAMUSCULAR | Status: AC
Start: 2024-07-19 — End: 2024-07-19
  Administered 2024-07-19: 10 mg via INTRAMUSCULAR
  Filled 2024-07-19: qty 1

## 2024-07-19 MED ORDER — BENZONATATE 100 MG PO CAPS: 100.0000 mg | ORAL_CAPSULE | Freq: Three times a day (TID) | ORAL | 0 refills | Status: AC

## 2024-07-19 NOTE — ED Triage Notes (Signed)
 Patient reports nasal congestion, productive cough and chest congestion this week . No fever or chills . He adds legs and arms muscle aches.

## 2024-07-19 NOTE — ED Provider Notes (Signed)
 [place] EMERGENCY DEPARTMENT AT [facility] Provider Note   CSN: [id number] Arrival date & time: 07/19/24  1900     Patient presents with: Nasal Congestion / Cough   Dustin Haney is a 48 y.o. male patient with noncontributory past medical history reports to emergency room with complaint of nasal congestion, nasal drainage, cough and bodyache starting 2 days ago.  Patient reports when he is laying down flat his nose becomes very congested and it makes it harder for him to breathe.  He denies any exertional shortness of breath.  He denies any chest pain.  He denies fever.   HPI     Prior to Admission medications   Medication Sig Start Date End Date Taking? Authorizing Provider  amoxicillin -clavulanate (AUGMENTIN ) 875-125 MG tablet Take 1 tablet by mouth every 12 (twelve) hours. 10/01/18   Maczis, Michael M, PA-C  Aspirin-Acetaminophen-Caffeine (GOODYS EXTRA STRENGTH PO) Take 1 packet by mouth every 6 (six) hours as needed (For chest pain.).    [provider]  cyclobenzaprine  (FLEXERIL ) 5 MG tablet Take 1 tablet (5 mg total) by mouth 3 (three) times daily as needed for muscle spasms. 01/19/18   Patt Alm Macho, MD  ibuprofen  (ADVIL ,MOTRIN ) 600 MG tablet Take 1 tablet (600 mg total) by mouth every 6 (six) hours as needed. 10/01/18   Maczis, Michael M, PA-C  ibuprofen  (ADVIL ,MOTRIN ) 600 MG tablet Take 1 tablet (600 mg total) by mouth every 6 (six) hours as needed. 01/19/18   Patt Alm Macho, MD  methocarbamol  (ROBAXIN ) 500 MG tablet Take 1 tablet (500 mg total) by mouth 2 (two) times daily. 04/18/22   Venter, Margaux, PA-C  ondansetron  (ZOFRAN ) 4 MG tablet Take 1 tablet (4 mg total) by mouth every 6 (six) hours. 11/01/21   Rayford Hedge, MD  oxymetazoline  (AFRIN NASAL SPRAY) 0.05 % nasal spray Place 1 spray into both nostrils 2 (two) times daily as needed for congestion. 10/01/18   Maczis, Michael M, PA-C  predniSONE  (STERAPRED UNI-PAK 21 TAB) 10 MG (21)  TBPK tablet Take by mouth daily. Follow package insert 04/18/22   Shepard, Margaux, PA-C  traMADol  (ULTRAM ) 50 MG tablet Take 1 tablet (50 mg total) by mouth every 6 (six) hours as needed. 10/01/18   Maczis, Michael M, PA-C    Allergies: Latex    Review of Systems  HENT:  Positive for congestion.     Updated Vital Signs BP 137/89 (BP Location: Right Arm)   Pulse 87   Temp 98.2 F (36.8 C)   Resp 20   SpO2 95%   Physical Exam Vitals and nursing note reviewed.  Constitutional:      General: He is not in acute distress.    Appearance: He is not toxic-appearing.  HENT:     Head: Normocephalic and atraumatic.     Nose: Congestion and rhinorrhea present.  Eyes:     General: No scleral icterus.    Conjunctiva/sclera: Conjunctivae normal.  Cardiovascular:     Rate and Rhythm: Normal rate and regular rhythm.     Pulses: Normal pulses.     Heart sounds: Normal heart sounds.  Pulmonary:     Effort: Pulmonary effort is normal. No respiratory distress.     Breath sounds: Normal breath sounds. No wheezing or rales.  Chest:     Chest wall: No tenderness.  Abdominal:     General: Abdomen is flat. Bowel sounds are normal. There is no distension.     Palpations: Abdomen is soft. There  is no mass.     Tenderness: There is no abdominal tenderness.  Musculoskeletal:     Right lower leg: No edema.     Left lower leg: No edema.  Skin:    General: Skin is warm and dry.     Findings: No lesion.  Neurological:     General: No focal deficit present.     Mental Status: He is alert and oriented to person, place, and time. Mental status is at baseline.     (all labs ordered are listed, but only abnormal results are displayed) Labs Reviewed - No data to display  EKG: None  Radiology: DG Chest 2 View Result Date: 07/19/2024 CLINICAL DATA:  Cough and congestion for 2 days EXAM: CHEST - 2 VIEW COMPARISON:  01/19/2018 FINDINGS: Frontal and lateral views of the chest demonstrate an  unremarkable cardiac silhouette. No airspace disease, effusion, or pneumothorax. No acute bony abnormalities. IMPRESSION: 1. No acute intrathoracic process. Electronically Signed   By: Ozell Daring M.D.   On: 07/19/2024 19:43     Procedures   Medications Ordered in the ED - No data to display                                  Medical Decision Making Amount and/or Complexity of Data Reviewed Radiology: ordered.  Risk OTC drugs. Prescription drug management.   Dustin Haney 48 y.o. presented today for URI like symptoms. Working DDx that I considered at this time includes, but not limited to, viral illness, pharyngitis, mono, sinusitis, electrolyte abnormality, AOM.  R/o DDx: these additional diagnoses are not consistent with patient's history, presentation, physical exam, labs/imaging findings.  Labs:  Respiratory Panel: pending   Imaging:  Chest x-ray negative for acute findings  Problem List / ED Course / Critical interventions / Medication management  Patient reports to emergency room with complaint of nasal congestion, runny nose, sinus pressure, productive cough for approximately 2 days.  On arrival he is hemodynamically stable and well-appearing.  He is afebrile.  On my exam his lungs are clear to auscultation bilaterally.  He has no history of COPD or asthma.  His chest x-ray shows no acute findings.  His symptoms seem most consistent with a viral URI with cough.  Will give Decadron  for symptom control.  Will send him home with albuterol  inhaler as needed for shortness of breath.  Discussed symptom management.  Home monitor had the results of respiratory panel on MyChart. I ordered medication including Decadron .  Albuterol  inhaler as needed. Reevaluation of the patient after these medicines showed that the patient improved Patients vitals assessed. Upon arrival patient is  hemodynamically stable.  I have reviewed the patients home medicines and have made adjustments  as needed. Feel stable for discharge with outpatient follow-up.  Given return precautions.      Final diagnoses:  Viral URI with cough    ED Discharge Orders          Ordered    benzonatate  (TESSALON ) 100 MG capsule  Every 8 hours        07/19/24 2155    loratadine  (CLARITIN ) 10 MG tablet  Daily        07/19/24 2155               Dustin Haney, Dustin SAILOR, PA-C 07/19/24 2158    Cottie Donnice PARAS, MD 07/19/24 [phone]

## 2024-07-19 NOTE — Discharge Instructions (Addendum)
 Please follow-up with primary care for recheck of symptoms.  Your flu and covid test was negative. Your chest x-ray is normal and does not show pneumonia.   You can take Claritin  once daily for nasal congestion.  You can also do nasal rinses. Take Tessalon  Perles as needed for cough. Use albuterol  inhaler as needed for shortness of breath. Return to emergency room with new or worsening symptoms.

## 2024-07-20 ENCOUNTER — Encounter (HOSPITAL_COMMUNITY): Payer: Self-pay

## 2024-07-20 NOTE — ED Notes (Signed)
 PT D/C'D AFTER INSTRUCTIONS REVIEWED. PT VERBALIZED UNDERSTANDING. NAD REPORTED OR NOTED AT THIS TIME.
